# Patient Record
Sex: Female | Born: 1940 | Race: White | Hispanic: No | Marital: Married
Health system: Southern US, Community
[De-identification: ages and names within clinical notes are randomized; demographics above are authoritative.]

---

## 2002-02-04 ENCOUNTER — Encounter: Payer: Self-pay | Admitting: Dermatology

## 2002-02-04 ENCOUNTER — Encounter: Admission: RE | Admit: 2002-02-04 | Discharge: 2002-02-04 | Payer: Self-pay | Admitting: Dermatology

## 2002-03-11 ENCOUNTER — Ambulatory Visit (HOSPITAL_COMMUNITY): Admission: RE | Admit: 2002-03-11 | Discharge: 2002-03-11 | Payer: Self-pay | Admitting: *Deleted

## 2002-07-09 ENCOUNTER — Other Ambulatory Visit: Admission: RE | Admit: 2002-07-09 | Discharge: 2002-07-09 | Payer: Self-pay | Admitting: *Deleted

## 2003-07-16 ENCOUNTER — Other Ambulatory Visit: Admission: RE | Admit: 2003-07-16 | Discharge: 2003-07-16 | Payer: Self-pay | Admitting: *Deleted

## 2005-08-01 ENCOUNTER — Other Ambulatory Visit: Admission: RE | Admit: 2005-08-01 | Discharge: 2005-08-01 | Payer: Self-pay | Admitting: Obstetrics and Gynecology

## 2006-08-08 ENCOUNTER — Other Ambulatory Visit: Admission: RE | Admit: 2006-08-08 | Discharge: 2006-08-08 | Payer: Self-pay | Admitting: Obstetrics and Gynecology

## 2007-08-28 ENCOUNTER — Other Ambulatory Visit: Admission: RE | Admit: 2007-08-28 | Discharge: 2007-08-28 | Payer: Self-pay | Admitting: Obstetrics and Gynecology

## 2008-08-30 ENCOUNTER — Other Ambulatory Visit: Admission: RE | Admit: 2008-08-30 | Discharge: 2008-08-30 | Payer: Self-pay | Admitting: Obstetrics & Gynecology

## 2014-03-19 ENCOUNTER — Encounter (INDEPENDENT_AMBULATORY_CARE_PROVIDER_SITE_OTHER): Payer: Self-pay | Admitting: Ophthalmology

## 2014-11-02 DIAGNOSIS — Z1389 Encounter for screening for other disorder: Secondary | ICD-10-CM | POA: Diagnosis not present

## 2014-11-02 DIAGNOSIS — R634 Abnormal weight loss: Secondary | ICD-10-CM | POA: Diagnosis not present

## 2014-11-02 DIAGNOSIS — D649 Anemia, unspecified: Secondary | ICD-10-CM | POA: Diagnosis not present

## 2014-11-02 DIAGNOSIS — Z681 Body mass index (BMI) 19 or less, adult: Secondary | ICD-10-CM | POA: Diagnosis not present

## 2014-11-02 DIAGNOSIS — R636 Underweight: Secondary | ICD-10-CM | POA: Diagnosis not present

## 2014-11-02 DIAGNOSIS — M859 Disorder of bone density and structure, unspecified: Secondary | ICD-10-CM | POA: Diagnosis not present

## 2014-11-02 DIAGNOSIS — M8580 Other specified disorders of bone density and structure, unspecified site: Secondary | ICD-10-CM | POA: Diagnosis not present

## 2015-03-29 DIAGNOSIS — Z1231 Encounter for screening mammogram for malignant neoplasm of breast: Secondary | ICD-10-CM | POA: Diagnosis not present

## 2015-05-05 DIAGNOSIS — D649 Anemia, unspecified: Secondary | ICD-10-CM | POA: Diagnosis not present

## 2015-05-05 DIAGNOSIS — E559 Vitamin D deficiency, unspecified: Secondary | ICD-10-CM | POA: Diagnosis not present

## 2015-05-05 DIAGNOSIS — Z0000 Encounter for general adult medical examination without abnormal findings: Secondary | ICD-10-CM | POA: Diagnosis not present

## 2015-05-05 DIAGNOSIS — R634 Abnormal weight loss: Secondary | ICD-10-CM | POA: Diagnosis not present

## 2015-05-12 DIAGNOSIS — E559 Vitamin D deficiency, unspecified: Secondary | ICD-10-CM | POA: Diagnosis not present

## 2015-05-12 DIAGNOSIS — I872 Venous insufficiency (chronic) (peripheral): Secondary | ICD-10-CM | POA: Diagnosis not present

## 2015-05-12 DIAGNOSIS — Z23 Encounter for immunization: Secondary | ICD-10-CM | POA: Diagnosis not present

## 2015-05-12 DIAGNOSIS — M8580 Other specified disorders of bone density and structure, unspecified site: Secondary | ICD-10-CM | POA: Diagnosis not present

## 2015-05-12 DIAGNOSIS — D649 Anemia, unspecified: Secondary | ICD-10-CM | POA: Diagnosis not present

## 2015-05-12 DIAGNOSIS — R636 Underweight: Secondary | ICD-10-CM | POA: Diagnosis not present

## 2015-05-12 DIAGNOSIS — M545 Low back pain: Secondary | ICD-10-CM | POA: Diagnosis not present

## 2015-05-12 DIAGNOSIS — E46 Unspecified protein-calorie malnutrition: Secondary | ICD-10-CM | POA: Diagnosis not present

## 2015-05-12 DIAGNOSIS — R634 Abnormal weight loss: Secondary | ICD-10-CM | POA: Diagnosis not present

## 2015-05-19 DIAGNOSIS — H2512 Age-related nuclear cataract, left eye: Secondary | ICD-10-CM | POA: Diagnosis not present

## 2015-05-19 DIAGNOSIS — H25041 Posterior subcapsular polar age-related cataract, right eye: Secondary | ICD-10-CM | POA: Diagnosis not present

## 2015-05-19 DIAGNOSIS — H25011 Cortical age-related cataract, right eye: Secondary | ICD-10-CM | POA: Diagnosis not present

## 2015-05-19 DIAGNOSIS — H2511 Age-related nuclear cataract, right eye: Secondary | ICD-10-CM | POA: Diagnosis not present

## 2015-05-30 DIAGNOSIS — Z1212 Encounter for screening for malignant neoplasm of rectum: Secondary | ICD-10-CM | POA: Diagnosis not present

## 2015-06-10 DIAGNOSIS — Z1211 Encounter for screening for malignant neoplasm of colon: Secondary | ICD-10-CM | POA: Diagnosis not present

## 2015-06-10 DIAGNOSIS — K5730 Diverticulosis of large intestine without perforation or abscess without bleeding: Secondary | ICD-10-CM | POA: Diagnosis not present

## 2015-06-10 DIAGNOSIS — Z800 Family history of malignant neoplasm of digestive organs: Secondary | ICD-10-CM | POA: Diagnosis not present

## 2015-11-10 DIAGNOSIS — R700 Elevated erythrocyte sedimentation rate: Secondary | ICD-10-CM | POA: Diagnosis not present

## 2015-11-10 DIAGNOSIS — Z681 Body mass index (BMI) 19 or less, adult: Secondary | ICD-10-CM | POA: Diagnosis not present

## 2015-11-10 DIAGNOSIS — M25551 Pain in right hip: Secondary | ICD-10-CM | POA: Diagnosis not present

## 2015-11-10 DIAGNOSIS — I872 Venous insufficiency (chronic) (peripheral): Secondary | ICD-10-CM | POA: Diagnosis not present

## 2015-11-10 DIAGNOSIS — R600 Localized edema: Secondary | ICD-10-CM | POA: Diagnosis not present

## 2015-11-10 DIAGNOSIS — D649 Anemia, unspecified: Secondary | ICD-10-CM | POA: Diagnosis not present

## 2015-11-10 DIAGNOSIS — M545 Low back pain: Secondary | ICD-10-CM | POA: Diagnosis not present

## 2015-11-10 DIAGNOSIS — D6489 Other specified anemias: Secondary | ICD-10-CM | POA: Diagnosis not present

## 2015-11-10 DIAGNOSIS — E46 Unspecified protein-calorie malnutrition: Secondary | ICD-10-CM | POA: Diagnosis not present

## 2015-11-14 ENCOUNTER — Other Ambulatory Visit: Payer: Self-pay | Admitting: Internal Medicine

## 2015-11-14 DIAGNOSIS — R634 Abnormal weight loss: Secondary | ICD-10-CM

## 2015-11-16 ENCOUNTER — Ambulatory Visit
Admission: RE | Admit: 2015-11-16 | Discharge: 2015-11-16 | Disposition: A | Discharge status: Home / Self Care | Payer: Commercial Managed Care - HMO | Source: Ambulatory Visit | Attending: Internal Medicine | Admitting: Internal Medicine

## 2015-11-16 DIAGNOSIS — K769 Liver disease, unspecified: Secondary | ICD-10-CM | POA: Diagnosis not present

## 2015-11-16 DIAGNOSIS — R634 Abnormal weight loss: Secondary | ICD-10-CM

## 2015-11-16 MED ORDER — IOPAMIDOL (ISOVUE-300) INJECTION 61%
100.0000 mL | Freq: Once | INTRAVENOUS | Status: AC | PRN
  Administered 2015-11-16: 100 mL via INTRAVENOUS

## 2015-11-22 ENCOUNTER — Ambulatory Visit (HOSPITAL_COMMUNITY)
Admission: RE | Admit: 2015-11-22 | Discharge: 2015-11-22 | Disposition: A | Discharge status: Home / Self Care | Payer: Commercial Managed Care - HMO | Source: Ambulatory Visit | Attending: Physician Assistant | Admitting: Physician Assistant

## 2015-11-22 ENCOUNTER — Other Ambulatory Visit (HOSPITAL_COMMUNITY): Payer: Self-pay | Admitting: Physician Assistant

## 2015-11-22 DIAGNOSIS — M79604 Pain in right leg: Secondary | ICD-10-CM | POA: Insufficient documentation

## 2015-11-22 DIAGNOSIS — M79661 Pain in right lower leg: Secondary | ICD-10-CM

## 2015-11-22 DIAGNOSIS — M2550 Pain in unspecified joint: Secondary | ICD-10-CM | POA: Diagnosis not present

## 2015-11-22 DIAGNOSIS — R5383 Other fatigue: Secondary | ICD-10-CM | POA: Diagnosis not present

## 2015-11-22 DIAGNOSIS — M7989 Other specified soft tissue disorders: Secondary | ICD-10-CM

## 2015-11-22 NOTE — Progress Notes (Signed)
VASCULAR LAB PRELIMINARY  PRELIMINARY  PRELIMINARY  PRELIMINARY  Bilateral lower extremity venous duplex completed.     Right:  No evidence of DVT, superficial thrombosis, or Baker's cyst.  Called results to Dr. Roxy Cedar nurse, Amy @3 :12 pm  Caoimhe Damron, RVT, RDMS 11/22/2015, 3:11 PM

## 2015-12-06 DIAGNOSIS — M069 Rheumatoid arthritis, unspecified: Secondary | ICD-10-CM | POA: Diagnosis not present

## 2015-12-06 DIAGNOSIS — D6489 Other specified anemias: Secondary | ICD-10-CM | POA: Diagnosis not present

## 2015-12-06 DIAGNOSIS — M0579 Rheumatoid arthritis with rheumatoid factor of multiple sites without organ or systems involvement: Secondary | ICD-10-CM | POA: Diagnosis not present

## 2015-12-06 DIAGNOSIS — D649 Anemia, unspecified: Secondary | ICD-10-CM | POA: Diagnosis not present

## 2015-12-06 DIAGNOSIS — M2550 Pain in unspecified joint: Secondary | ICD-10-CM | POA: Diagnosis not present

## 2015-12-06 DIAGNOSIS — R5383 Other fatigue: Secondary | ICD-10-CM | POA: Diagnosis not present

## 2016-01-05 DIAGNOSIS — M0579 Rheumatoid arthritis with rheumatoid factor of multiple sites without organ or systems involvement: Secondary | ICD-10-CM | POA: Diagnosis not present

## 2016-01-05 DIAGNOSIS — M2550 Pain in unspecified joint: Secondary | ICD-10-CM | POA: Diagnosis not present

## 2016-01-05 DIAGNOSIS — R5383 Other fatigue: Secondary | ICD-10-CM | POA: Diagnosis not present

## 2016-01-05 DIAGNOSIS — M353 Polymyalgia rheumatica: Secondary | ICD-10-CM | POA: Diagnosis not present

## 2016-03-08 DIAGNOSIS — R5383 Other fatigue: Secondary | ICD-10-CM | POA: Diagnosis not present

## 2016-03-08 DIAGNOSIS — M0579 Rheumatoid arthritis with rheumatoid factor of multiple sites without organ or systems involvement: Secondary | ICD-10-CM | POA: Diagnosis not present

## 2016-03-08 DIAGNOSIS — M2550 Pain in unspecified joint: Secondary | ICD-10-CM | POA: Diagnosis not present

## 2016-03-08 DIAGNOSIS — M5489 Other dorsalgia: Secondary | ICD-10-CM | POA: Diagnosis not present

## 2016-03-08 DIAGNOSIS — M353 Polymyalgia rheumatica: Secondary | ICD-10-CM | POA: Diagnosis not present

## 2016-03-12 ENCOUNTER — Other Ambulatory Visit: Payer: Self-pay | Admitting: Rheumatology

## 2016-03-12 DIAGNOSIS — M545 Low back pain: Secondary | ICD-10-CM

## 2016-03-22 ENCOUNTER — Ambulatory Visit
Admission: RE | Admit: 2016-03-22 | Discharge: 2016-03-22 | Disposition: A | Discharge status: Home / Self Care | Payer: Commercial Managed Care - HMO | Source: Ambulatory Visit | Attending: Rheumatology | Admitting: Rheumatology

## 2016-03-22 DIAGNOSIS — M545 Low back pain: Secondary | ICD-10-CM

## 2016-03-22 DIAGNOSIS — M4806 Spinal stenosis, lumbar region: Secondary | ICD-10-CM | POA: Diagnosis not present

## 2016-04-11 DIAGNOSIS — M4806 Spinal stenosis, lumbar region: Secondary | ICD-10-CM | POA: Diagnosis not present

## 2016-04-24 DIAGNOSIS — M4806 Spinal stenosis, lumbar region: Secondary | ICD-10-CM | POA: Diagnosis not present

## 2016-05-02 DIAGNOSIS — Z1231 Encounter for screening mammogram for malignant neoplasm of breast: Secondary | ICD-10-CM | POA: Diagnosis not present

## 2016-05-10 DIAGNOSIS — D649 Anemia, unspecified: Secondary | ICD-10-CM | POA: Diagnosis not present

## 2016-05-10 DIAGNOSIS — E559 Vitamin D deficiency, unspecified: Secondary | ICD-10-CM | POA: Diagnosis not present

## 2016-05-10 DIAGNOSIS — Z0000 Encounter for general adult medical examination without abnormal findings: Secondary | ICD-10-CM | POA: Diagnosis not present

## 2016-05-10 DIAGNOSIS — R8299 Other abnormal findings in urine: Secondary | ICD-10-CM | POA: Diagnosis not present

## 2016-05-10 DIAGNOSIS — E46 Unspecified protein-calorie malnutrition: Secondary | ICD-10-CM | POA: Diagnosis not present

## 2016-05-17 DIAGNOSIS — M859 Disorder of bone density and structure, unspecified: Secondary | ICD-10-CM | POA: Diagnosis not present

## 2016-05-17 DIAGNOSIS — E441 Mild protein-calorie malnutrition: Secondary | ICD-10-CM | POA: Diagnosis not present

## 2016-05-17 DIAGNOSIS — D6489 Other specified anemias: Secondary | ICD-10-CM | POA: Diagnosis not present

## 2016-05-17 DIAGNOSIS — Z0000 Encounter for general adult medical examination without abnormal findings: Secondary | ICD-10-CM | POA: Diagnosis not present

## 2016-05-17 DIAGNOSIS — I868 Varicose veins of other specified sites: Secondary | ICD-10-CM | POA: Diagnosis not present

## 2016-05-17 DIAGNOSIS — M353 Polymyalgia rheumatica: Secondary | ICD-10-CM | POA: Diagnosis not present

## 2016-05-17 DIAGNOSIS — R636 Underweight: Secondary | ICD-10-CM | POA: Diagnosis not present

## 2016-05-17 DIAGNOSIS — M545 Low back pain: Secondary | ICD-10-CM | POA: Diagnosis not present

## 2016-05-17 DIAGNOSIS — M0689 Other specified rheumatoid arthritis, multiple sites: Secondary | ICD-10-CM | POA: Diagnosis not present

## 2016-05-23 DIAGNOSIS — Z1212 Encounter for screening for malignant neoplasm of rectum: Secondary | ICD-10-CM | POA: Diagnosis not present

## 2016-05-24 DIAGNOSIS — M4806 Spinal stenosis, lumbar region: Secondary | ICD-10-CM | POA: Diagnosis not present

## 2016-06-21 DIAGNOSIS — M48061 Spinal stenosis, lumbar region without neurogenic claudication: Secondary | ICD-10-CM | POA: Diagnosis not present

## 2016-08-08 DIAGNOSIS — D6489 Other specified anemias: Secondary | ICD-10-CM | POA: Diagnosis not present

## 2016-08-08 DIAGNOSIS — D649 Anemia, unspecified: Secondary | ICD-10-CM | POA: Diagnosis not present

## 2016-08-09 DIAGNOSIS — H903 Sensorineural hearing loss, bilateral: Secondary | ICD-10-CM | POA: Diagnosis not present

## 2016-08-24 DIAGNOSIS — H903 Sensorineural hearing loss, bilateral: Secondary | ICD-10-CM | POA: Diagnosis not present

## 2016-11-13 DIAGNOSIS — D692 Other nonthrombocytopenic purpura: Secondary | ICD-10-CM | POA: Diagnosis not present

## 2016-11-13 DIAGNOSIS — M353 Polymyalgia rheumatica: Secondary | ICD-10-CM | POA: Diagnosis not present

## 2016-11-13 DIAGNOSIS — R760 Raised antibody titer: Secondary | ICD-10-CM | POA: Diagnosis not present

## 2016-11-13 DIAGNOSIS — E46 Unspecified protein-calorie malnutrition: Secondary | ICD-10-CM | POA: Diagnosis not present

## 2016-11-13 DIAGNOSIS — I878 Other specified disorders of veins: Secondary | ICD-10-CM | POA: Diagnosis not present

## 2016-11-13 DIAGNOSIS — D649 Anemia, unspecified: Secondary | ICD-10-CM | POA: Diagnosis not present

## 2016-11-13 DIAGNOSIS — M0689 Other specified rheumatoid arthritis, multiple sites: Secondary | ICD-10-CM | POA: Diagnosis not present

## 2016-11-13 DIAGNOSIS — Z681 Body mass index (BMI) 19 or less, adult: Secondary | ICD-10-CM | POA: Diagnosis not present

## 2016-11-13 DIAGNOSIS — M545 Low back pain: Secondary | ICD-10-CM | POA: Diagnosis not present

## 2016-11-27 DIAGNOSIS — M353 Polymyalgia rheumatica: Secondary | ICD-10-CM | POA: Diagnosis not present

## 2016-11-27 DIAGNOSIS — M0579 Rheumatoid arthritis with rheumatoid factor of multiple sites without organ or systems involvement: Secondary | ICD-10-CM | POA: Diagnosis not present

## 2016-11-27 DIAGNOSIS — Z681 Body mass index (BMI) 19 or less, adult: Secondary | ICD-10-CM | POA: Diagnosis not present

## 2016-11-27 DIAGNOSIS — R5383 Other fatigue: Secondary | ICD-10-CM | POA: Diagnosis not present

## 2016-11-27 DIAGNOSIS — M2550 Pain in unspecified joint: Secondary | ICD-10-CM | POA: Diagnosis not present

## 2016-11-27 DIAGNOSIS — M5489 Other dorsalgia: Secondary | ICD-10-CM | POA: Diagnosis not present

## 2017-02-13 DIAGNOSIS — Z681 Body mass index (BMI) 19 or less, adult: Secondary | ICD-10-CM | POA: Diagnosis not present

## 2017-02-13 DIAGNOSIS — M5489 Other dorsalgia: Secondary | ICD-10-CM | POA: Diagnosis not present

## 2017-02-13 DIAGNOSIS — R768 Other specified abnormal immunological findings in serum: Secondary | ICD-10-CM | POA: Diagnosis not present

## 2017-02-13 DIAGNOSIS — M353 Polymyalgia rheumatica: Secondary | ICD-10-CM | POA: Diagnosis not present

## 2017-02-13 DIAGNOSIS — M2550 Pain in unspecified joint: Secondary | ICD-10-CM | POA: Diagnosis not present

## 2017-02-13 DIAGNOSIS — R5383 Other fatigue: Secondary | ICD-10-CM | POA: Diagnosis not present

## 2017-04-23 DIAGNOSIS — M48061 Spinal stenosis, lumbar region without neurogenic claudication: Secondary | ICD-10-CM | POA: Diagnosis not present

## 2017-04-26 DIAGNOSIS — M48061 Spinal stenosis, lumbar region without neurogenic claudication: Secondary | ICD-10-CM | POA: Diagnosis not present

## 2017-04-26 DIAGNOSIS — M545 Low back pain: Secondary | ICD-10-CM | POA: Diagnosis not present

## 2017-04-30 DIAGNOSIS — M48061 Spinal stenosis, lumbar region without neurogenic claudication: Secondary | ICD-10-CM | POA: Diagnosis not present

## 2017-04-30 DIAGNOSIS — M545 Low back pain: Secondary | ICD-10-CM | POA: Diagnosis not present

## 2017-05-02 DIAGNOSIS — M48061 Spinal stenosis, lumbar region without neurogenic claudication: Secondary | ICD-10-CM | POA: Diagnosis not present

## 2017-05-02 DIAGNOSIS — M545 Low back pain: Secondary | ICD-10-CM | POA: Diagnosis not present

## 2017-05-03 DIAGNOSIS — Z1231 Encounter for screening mammogram for malignant neoplasm of breast: Secondary | ICD-10-CM | POA: Diagnosis not present

## 2017-05-07 DIAGNOSIS — M48061 Spinal stenosis, lumbar region without neurogenic claudication: Secondary | ICD-10-CM | POA: Diagnosis not present

## 2017-05-07 DIAGNOSIS — M545 Low back pain: Secondary | ICD-10-CM | POA: Diagnosis not present

## 2017-05-09 DIAGNOSIS — M545 Low back pain: Secondary | ICD-10-CM | POA: Diagnosis not present

## 2017-05-09 DIAGNOSIS — M48061 Spinal stenosis, lumbar region without neurogenic claudication: Secondary | ICD-10-CM | POA: Diagnosis not present

## 2017-05-14 DIAGNOSIS — D649 Anemia, unspecified: Secondary | ICD-10-CM | POA: Diagnosis not present

## 2017-05-14 DIAGNOSIS — M859 Disorder of bone density and structure, unspecified: Secondary | ICD-10-CM | POA: Diagnosis not present

## 2017-05-14 DIAGNOSIS — Z0000 Encounter for general adult medical examination without abnormal findings: Secondary | ICD-10-CM | POA: Diagnosis not present

## 2017-05-15 DIAGNOSIS — M48061 Spinal stenosis, lumbar region without neurogenic claudication: Secondary | ICD-10-CM | POA: Diagnosis not present

## 2017-05-15 DIAGNOSIS — M545 Low back pain: Secondary | ICD-10-CM | POA: Diagnosis not present

## 2017-05-15 DIAGNOSIS — D649 Anemia, unspecified: Secondary | ICD-10-CM | POA: Diagnosis not present

## 2017-05-17 DIAGNOSIS — M48061 Spinal stenosis, lumbar region without neurogenic claudication: Secondary | ICD-10-CM | POA: Diagnosis not present

## 2017-05-17 DIAGNOSIS — M545 Low back pain: Secondary | ICD-10-CM | POA: Diagnosis not present

## 2017-05-20 DIAGNOSIS — M48061 Spinal stenosis, lumbar region without neurogenic claudication: Secondary | ICD-10-CM | POA: Diagnosis not present

## 2017-05-20 DIAGNOSIS — M545 Low back pain: Secondary | ICD-10-CM | POA: Diagnosis not present

## 2017-05-21 DIAGNOSIS — E46 Unspecified protein-calorie malnutrition: Secondary | ICD-10-CM | POA: Diagnosis not present

## 2017-05-21 DIAGNOSIS — M069 Rheumatoid arthritis, unspecified: Secondary | ICD-10-CM | POA: Diagnosis not present

## 2017-05-21 DIAGNOSIS — M48061 Spinal stenosis, lumbar region without neurogenic claudication: Secondary | ICD-10-CM | POA: Diagnosis not present

## 2017-05-21 DIAGNOSIS — R700 Elevated erythrocyte sedimentation rate: Secondary | ICD-10-CM | POA: Diagnosis not present

## 2017-05-21 DIAGNOSIS — Z1389 Encounter for screening for other disorder: Secondary | ICD-10-CM | POA: Diagnosis not present

## 2017-05-21 DIAGNOSIS — M353 Polymyalgia rheumatica: Secondary | ICD-10-CM | POA: Diagnosis not present

## 2017-05-21 DIAGNOSIS — R760 Raised antibody titer: Secondary | ICD-10-CM | POA: Diagnosis not present

## 2017-05-21 DIAGNOSIS — R918 Other nonspecific abnormal finding of lung field: Secondary | ICD-10-CM | POA: Diagnosis not present

## 2017-05-21 DIAGNOSIS — R636 Underweight: Secondary | ICD-10-CM | POA: Diagnosis not present

## 2017-05-21 DIAGNOSIS — M545 Low back pain: Secondary | ICD-10-CM | POA: Diagnosis not present

## 2017-05-21 DIAGNOSIS — D692 Other nonthrombocytopenic purpura: Secondary | ICD-10-CM | POA: Diagnosis not present

## 2017-05-21 DIAGNOSIS — Z0000 Encounter for general adult medical examination without abnormal findings: Secondary | ICD-10-CM | POA: Diagnosis not present

## 2017-05-21 DIAGNOSIS — Z681 Body mass index (BMI) 19 or less, adult: Secondary | ICD-10-CM | POA: Diagnosis not present

## 2017-05-21 DIAGNOSIS — Z23 Encounter for immunization: Secondary | ICD-10-CM | POA: Diagnosis not present

## 2017-05-24 DIAGNOSIS — Z1212 Encounter for screening for malignant neoplasm of rectum: Secondary | ICD-10-CM | POA: Diagnosis not present

## 2017-05-27 DIAGNOSIS — M545 Low back pain: Secondary | ICD-10-CM | POA: Diagnosis not present

## 2017-05-27 DIAGNOSIS — M48061 Spinal stenosis, lumbar region without neurogenic claudication: Secondary | ICD-10-CM | POA: Diagnosis not present

## 2017-05-31 DIAGNOSIS — M48061 Spinal stenosis, lumbar region without neurogenic claudication: Secondary | ICD-10-CM | POA: Diagnosis not present

## 2017-05-31 DIAGNOSIS — M545 Low back pain: Secondary | ICD-10-CM | POA: Diagnosis not present

## 2017-06-03 DIAGNOSIS — M48061 Spinal stenosis, lumbar region without neurogenic claudication: Secondary | ICD-10-CM | POA: Diagnosis not present

## 2017-06-03 DIAGNOSIS — M545 Low back pain: Secondary | ICD-10-CM | POA: Diagnosis not present

## 2017-06-05 DIAGNOSIS — M48061 Spinal stenosis, lumbar region without neurogenic claudication: Secondary | ICD-10-CM | POA: Diagnosis not present

## 2017-06-05 DIAGNOSIS — M545 Low back pain: Secondary | ICD-10-CM | POA: Diagnosis not present

## 2017-06-06 DIAGNOSIS — H25043 Posterior subcapsular polar age-related cataract, bilateral: Secondary | ICD-10-CM | POA: Diagnosis not present

## 2017-06-06 DIAGNOSIS — H18413 Arcus senilis, bilateral: Secondary | ICD-10-CM | POA: Diagnosis not present

## 2017-06-06 DIAGNOSIS — H25013 Cortical age-related cataract, bilateral: Secondary | ICD-10-CM | POA: Diagnosis not present

## 2017-06-06 DIAGNOSIS — H2512 Age-related nuclear cataract, left eye: Secondary | ICD-10-CM | POA: Diagnosis not present

## 2017-06-06 DIAGNOSIS — H2513 Age-related nuclear cataract, bilateral: Secondary | ICD-10-CM | POA: Diagnosis not present

## 2017-06-10 DIAGNOSIS — M48061 Spinal stenosis, lumbar region without neurogenic claudication: Secondary | ICD-10-CM | POA: Diagnosis not present

## 2017-06-10 DIAGNOSIS — M545 Low back pain: Secondary | ICD-10-CM | POA: Diagnosis not present

## 2017-06-13 DIAGNOSIS — M545 Low back pain: Secondary | ICD-10-CM | POA: Diagnosis not present

## 2017-06-13 DIAGNOSIS — M48061 Spinal stenosis, lumbar region without neurogenic claudication: Secondary | ICD-10-CM | POA: Diagnosis not present

## 2017-07-18 DIAGNOSIS — R768 Other specified abnormal immunological findings in serum: Secondary | ICD-10-CM | POA: Diagnosis not present

## 2017-07-18 DIAGNOSIS — M353 Polymyalgia rheumatica: Secondary | ICD-10-CM | POA: Diagnosis not present

## 2017-07-18 DIAGNOSIS — M5489 Other dorsalgia: Secondary | ICD-10-CM | POA: Diagnosis not present

## 2017-07-18 DIAGNOSIS — R5383 Other fatigue: Secondary | ICD-10-CM | POA: Diagnosis not present

## 2017-07-18 DIAGNOSIS — Z681 Body mass index (BMI) 19 or less, adult: Secondary | ICD-10-CM | POA: Diagnosis not present

## 2017-07-18 DIAGNOSIS — M2550 Pain in unspecified joint: Secondary | ICD-10-CM | POA: Diagnosis not present

## 2017-07-24 DIAGNOSIS — Z79899 Other long term (current) drug therapy: Secondary | ICD-10-CM | POA: Diagnosis not present

## 2017-07-24 DIAGNOSIS — M1990 Unspecified osteoarthritis, unspecified site: Secondary | ICD-10-CM | POA: Diagnosis not present

## 2017-07-24 DIAGNOSIS — R011 Cardiac murmur, unspecified: Secondary | ICD-10-CM | POA: Diagnosis not present

## 2017-07-24 DIAGNOSIS — H2512 Age-related nuclear cataract, left eye: Secondary | ICD-10-CM | POA: Diagnosis not present

## 2017-07-25 DIAGNOSIS — H2511 Age-related nuclear cataract, right eye: Secondary | ICD-10-CM | POA: Diagnosis not present

## 2017-08-14 DIAGNOSIS — H2511 Age-related nuclear cataract, right eye: Secondary | ICD-10-CM | POA: Diagnosis not present

## 2017-08-14 DIAGNOSIS — Z79899 Other long term (current) drug therapy: Secondary | ICD-10-CM | POA: Diagnosis not present

## 2017-11-16 IMAGING — MR MR LUMBAR SPINE W/O CM
4 of 5 series · 19 of 48 positions shown · non-contrast
Comparison: CT abdomen/pelvis 11/16/2015

CLINICAL DATA: Low back pain radiating to both hips.

EXAM:
MRI LUMBAR SPINE WITHOUT CONTRAST
TECHNIQUE: Multiplanar, multisequence MR imaging of the lumbar spine was
performed. No intravenous contrast was administered.

[Series 6: T2 · sagittal · 4.0mm · 0.73mm/px · 7 of 15 slices shown (1 of 2)]
[im 1/15]
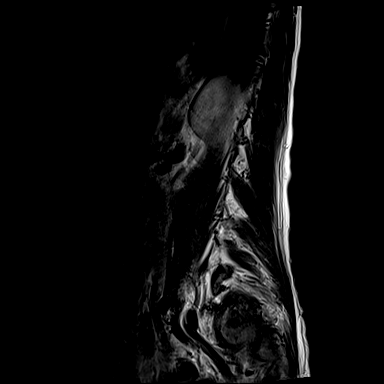
[im 3/15]
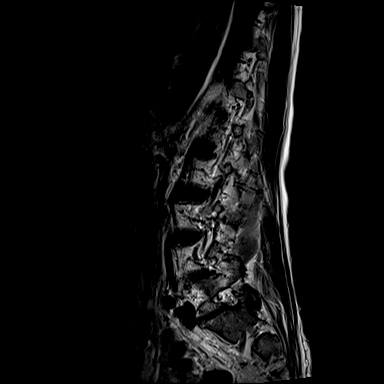
[im 5/15]
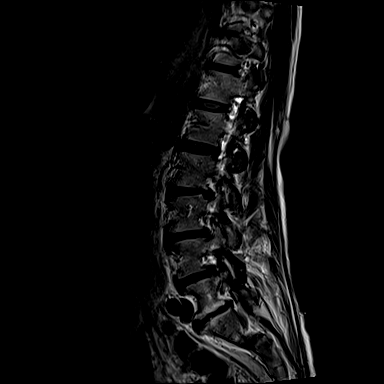
[im 8/15]
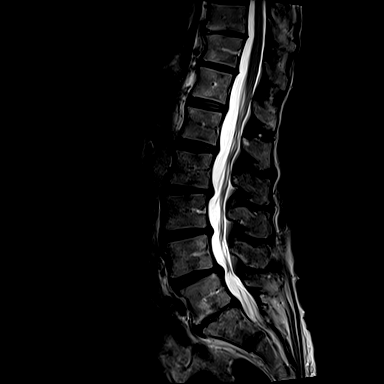
[im 10/15]
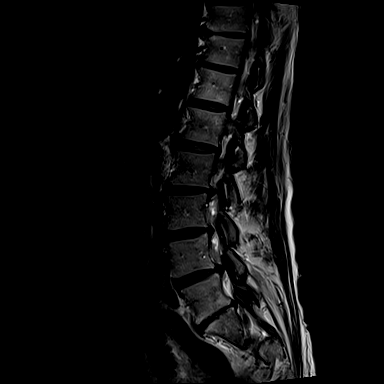
[im 12/15]
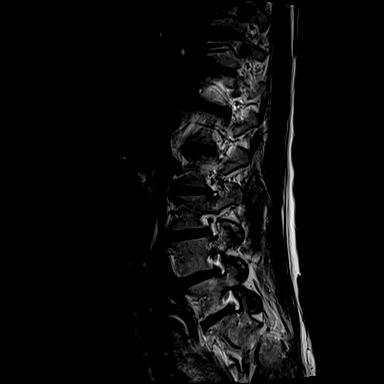
[im 15/15]
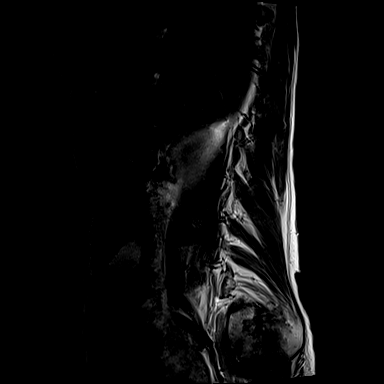

[Series 7: T1 · sagittal · 4.0mm · 0.73mm/px · 3 of 15 slices shown (1 of 2)]
[im 3/15]
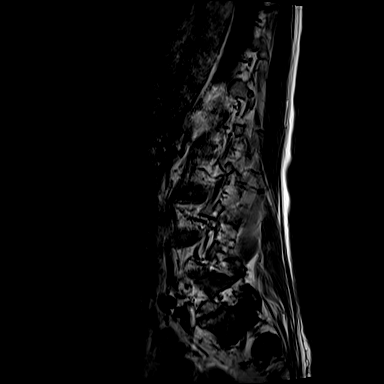
[im 8/15]
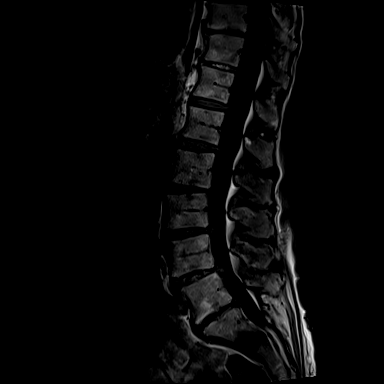
[im 12/15]
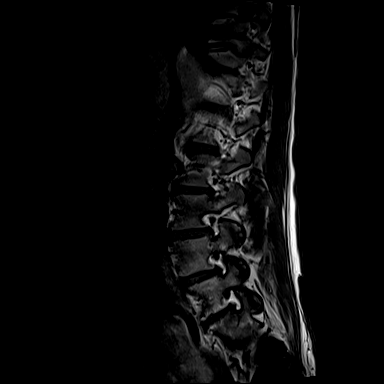

[Series 11: T1 · axial · 4.0mm · 0.28mm/px · z∈[-58,+51]mm · 3 of 34 slices shown (2 of 2)]
[im 6/34]
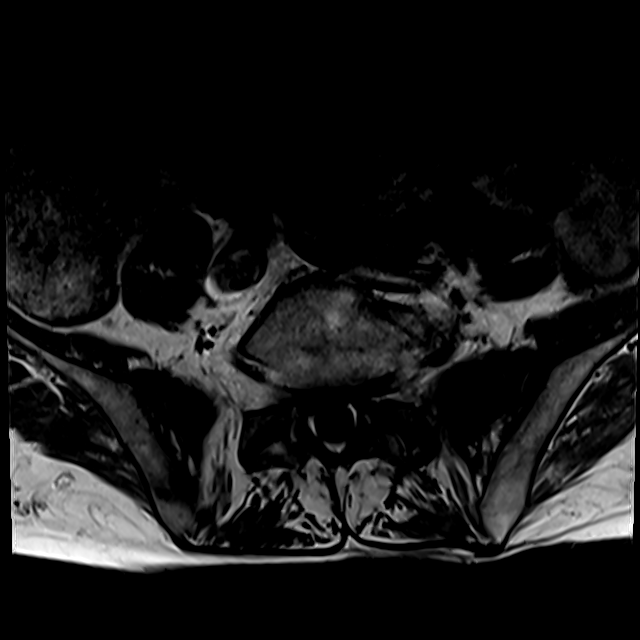
[im 18/34]
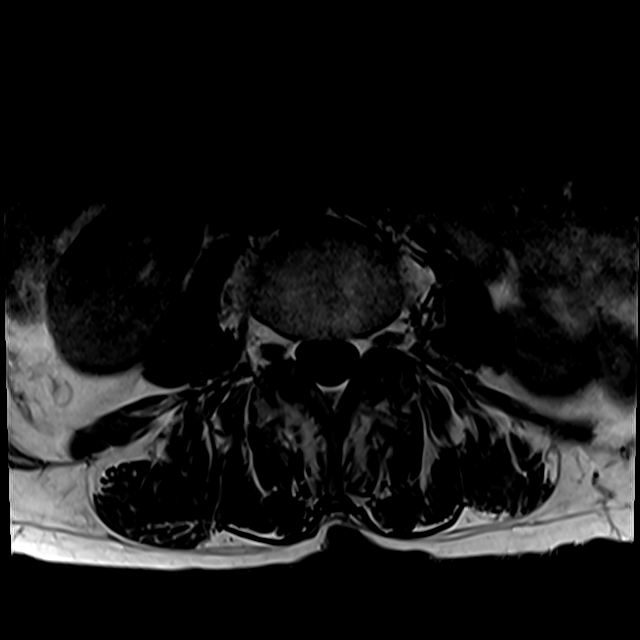
[im 28/34]
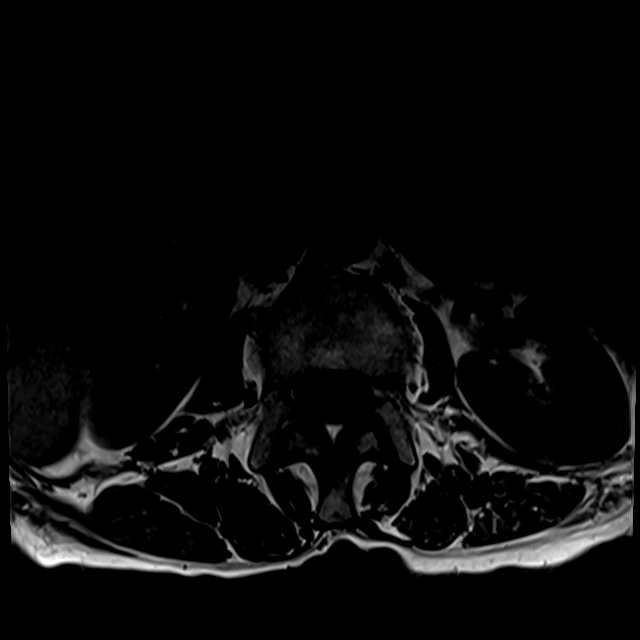

[Series 14: T2 · axial · 4.0mm · 0.28mm/px · z∈[-83,+51]mm · 6 of 34 slices shown (2 of 2)]
[im 1/34]
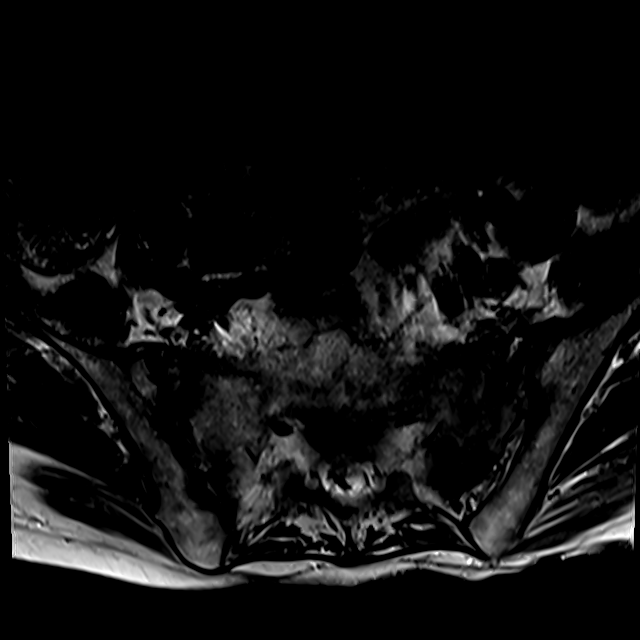
[im 6/34]
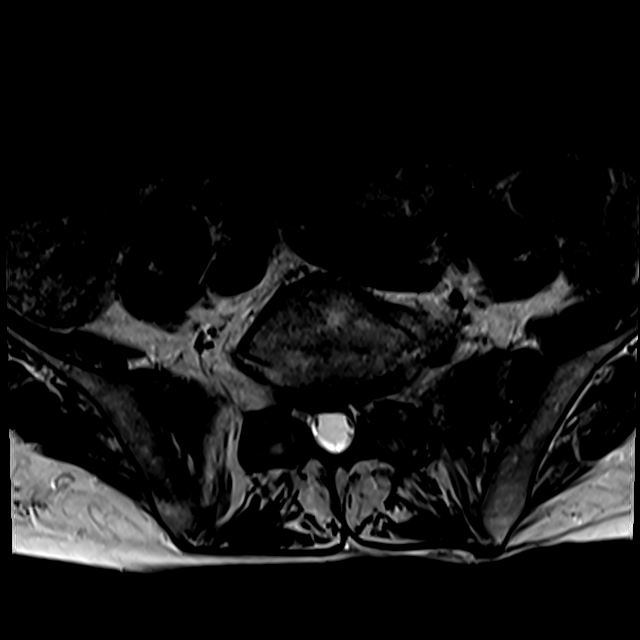
[im 11/34]
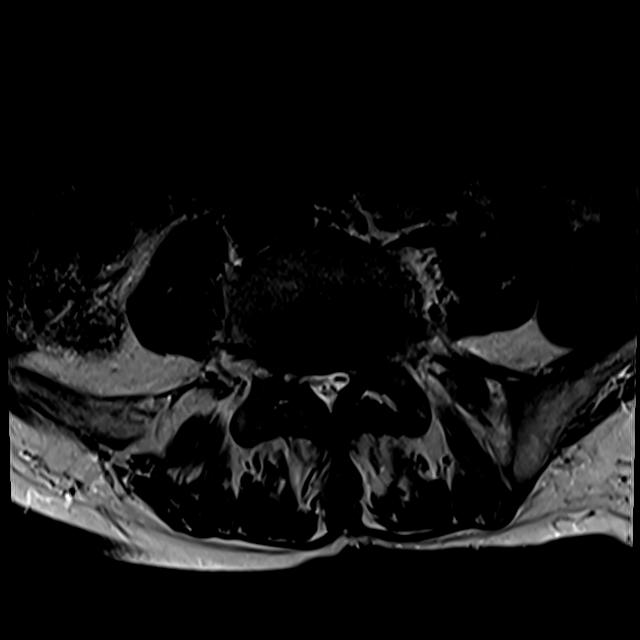
[im 16/34]
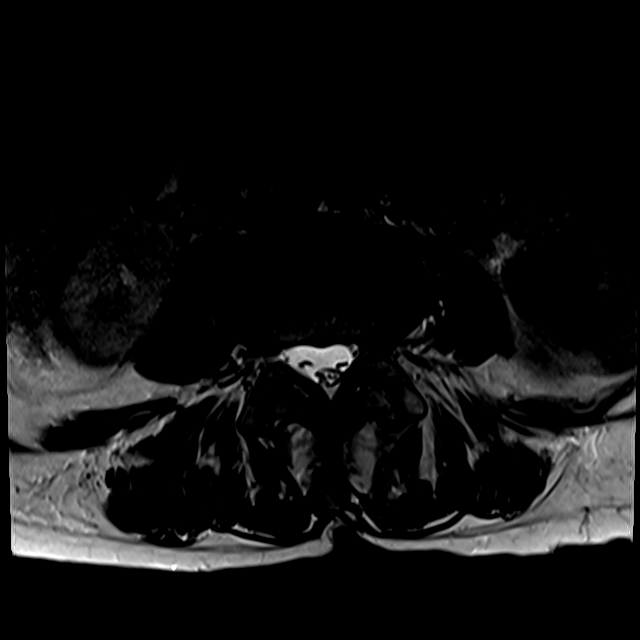
[im 18/34]
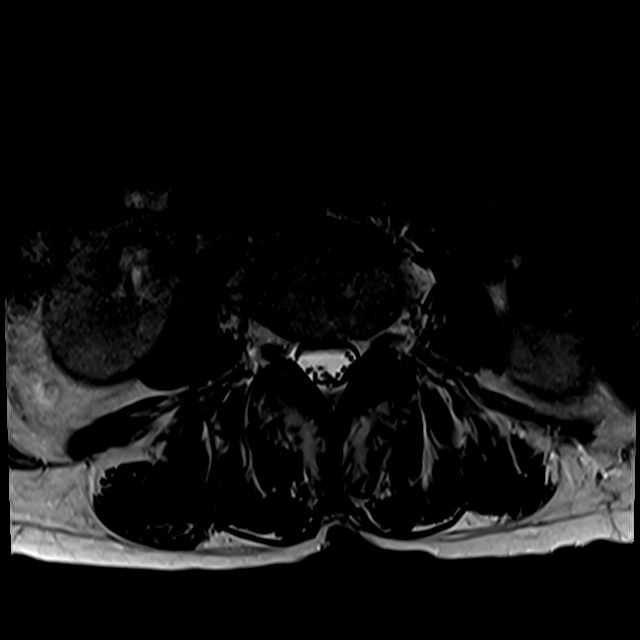
[im 28/34]
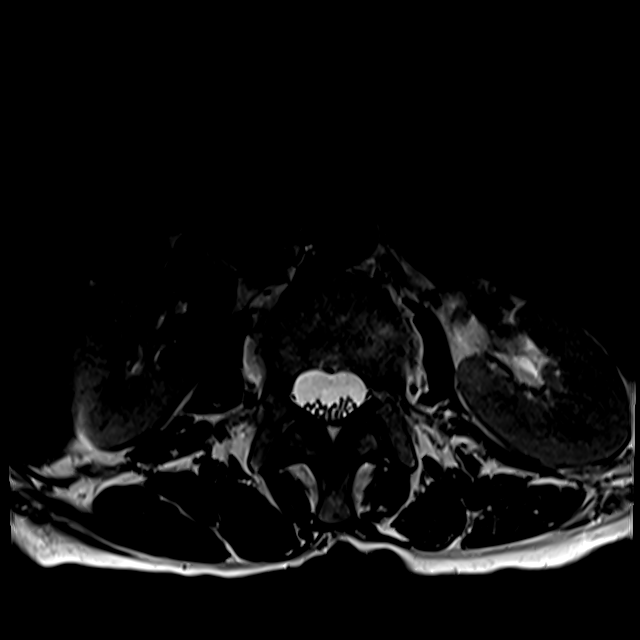

[19 of 48 positions shown; findings below may reference images not displayed]

FINDINGS: Segmentation:  Normal.  Lowest disc space is L5-S1

Alignment:  Normal

Vertebrae: Degenerative endplate signal changes at L5-S1. No focal
marrow lesion. Mild left L5-S1 facet edema.

Conus medullaris: Extends to the L1 level and appears normal.

Paraspinal and other soft tissues: Unremarkable

Disc levels:

There is mild degenerative disc disease at T10-11 and T11-12 without
spinal canal or neural foraminal stenosis.

T12-L1: Normal disc space and facet joints. No spinal canal
stenosis. No neuroforaminal stenosis.

L1-L2: Normal disc space and facet joints. No spinal canal stenosis.
No neuroforaminal stenosis.

L2-L3: Small disc bulge and mild facet hypertrophy. No spinal canal
stenosis. Mild bilateral neuroforaminal stenosis.

L3-L4: Disc bulge with superimposed left foraminal protrusion. Mild
bilateral facet hypertrophy. Narrowing of the left lateral recess.
No spinal canal stenosis. Mild right and mild to moderate left
neuroforaminal stenosis.

L4-L5: Severe bilateral facet hypertrophy with fluid in widening of
the left facet joint. Moderate disc bulge with small left foraminal
protrusion. Mild to moderate spinal canal stenosis. Mild left
neuroforaminal stenosis.

L5-S1: Disc space narrowing. No spinal canal stenosis. No
neuroforaminal stenosis.
IMPRESSION: 1. Mild-to-moderate spinal canal stenosis at L4-L5 secondary to disc
bulge and severe facet arthrosis.
2. Fluid within and widening of the left L4-L5 facet joint may
indicate a degree of segmental instability.
3. Mild left L5-S1 facet edema.

## 2017-11-19 DIAGNOSIS — M353 Polymyalgia rheumatica: Secondary | ICD-10-CM | POA: Diagnosis not present

## 2017-11-19 DIAGNOSIS — E46 Unspecified protein-calorie malnutrition: Secondary | ICD-10-CM | POA: Diagnosis not present

## 2017-11-19 DIAGNOSIS — Z6820 Body mass index (BMI) 20.0-20.9, adult: Secondary | ICD-10-CM | POA: Diagnosis not present

## 2017-11-19 DIAGNOSIS — R600 Localized edema: Secondary | ICD-10-CM | POA: Diagnosis not present

## 2017-11-19 DIAGNOSIS — M859 Disorder of bone density and structure, unspecified: Secondary | ICD-10-CM | POA: Diagnosis not present

## 2017-11-19 DIAGNOSIS — D692 Other nonthrombocytopenic purpura: Secondary | ICD-10-CM | POA: Diagnosis not present

## 2017-11-19 DIAGNOSIS — L723 Sebaceous cyst: Secondary | ICD-10-CM | POA: Diagnosis not present

## 2018-03-20 DIAGNOSIS — H02839 Dermatochalasis of unspecified eye, unspecified eyelid: Secondary | ICD-10-CM | POA: Diagnosis not present

## 2018-03-20 DIAGNOSIS — H18413 Arcus senilis, bilateral: Secondary | ICD-10-CM | POA: Diagnosis not present

## 2018-03-20 DIAGNOSIS — Z961 Presence of intraocular lens: Secondary | ICD-10-CM | POA: Diagnosis not present

## 2018-04-23 DIAGNOSIS — M859 Disorder of bone density and structure, unspecified: Secondary | ICD-10-CM | POA: Diagnosis not present

## 2018-05-05 DIAGNOSIS — Z1231 Encounter for screening mammogram for malignant neoplasm of breast: Secondary | ICD-10-CM | POA: Diagnosis not present

## 2018-05-20 DIAGNOSIS — Z0000 Encounter for general adult medical examination without abnormal findings: Secondary | ICD-10-CM | POA: Diagnosis not present

## 2018-05-20 DIAGNOSIS — D6489 Other specified anemias: Secondary | ICD-10-CM | POA: Diagnosis not present

## 2018-05-20 DIAGNOSIS — R82998 Other abnormal findings in urine: Secondary | ICD-10-CM | POA: Diagnosis not present

## 2018-05-20 DIAGNOSIS — M859 Disorder of bone density and structure, unspecified: Secondary | ICD-10-CM | POA: Diagnosis not present

## 2018-05-27 DIAGNOSIS — M859 Disorder of bone density and structure, unspecified: Secondary | ICD-10-CM | POA: Diagnosis not present

## 2018-05-27 DIAGNOSIS — R918 Other nonspecific abnormal finding of lung field: Secondary | ICD-10-CM | POA: Diagnosis not present

## 2018-05-27 DIAGNOSIS — E559 Vitamin D deficiency, unspecified: Secondary | ICD-10-CM | POA: Diagnosis not present

## 2018-05-27 DIAGNOSIS — I868 Varicose veins of other specified sites: Secondary | ICD-10-CM | POA: Diagnosis not present

## 2018-05-27 DIAGNOSIS — H9193 Unspecified hearing loss, bilateral: Secondary | ICD-10-CM | POA: Diagnosis not present

## 2018-05-27 DIAGNOSIS — Z0000 Encounter for general adult medical examination without abnormal findings: Secondary | ICD-10-CM | POA: Diagnosis not present

## 2018-05-27 DIAGNOSIS — M353 Polymyalgia rheumatica: Secondary | ICD-10-CM | POA: Diagnosis not present

## 2018-05-27 DIAGNOSIS — R636 Underweight: Secondary | ICD-10-CM | POA: Diagnosis not present

## 2018-05-27 DIAGNOSIS — E46 Unspecified protein-calorie malnutrition: Secondary | ICD-10-CM | POA: Diagnosis not present

## 2018-05-27 DIAGNOSIS — Z23 Encounter for immunization: Secondary | ICD-10-CM | POA: Diagnosis not present

## 2018-05-28 DIAGNOSIS — Z1212 Encounter for screening for malignant neoplasm of rectum: Secondary | ICD-10-CM | POA: Diagnosis not present

## 2020-02-22 ENCOUNTER — Other Ambulatory Visit (HOSPITAL_COMMUNITY): Payer: Self-pay | Admitting: Adult Health

## 2020-02-22 DIAGNOSIS — R011 Cardiac murmur, unspecified: Secondary | ICD-10-CM

## 2020-02-29 ENCOUNTER — Encounter (HOSPITAL_COMMUNITY): Payer: Self-pay | Admitting: Adult Health

## 2020-03-25 ENCOUNTER — Other Ambulatory Visit: Payer: Self-pay

## 2020-03-25 ENCOUNTER — Ambulatory Visit (HOSPITAL_COMMUNITY): Payer: Medicare Other | Attending: Cardiovascular Disease

## 2020-03-25 DIAGNOSIS — R011 Cardiac murmur, unspecified: Secondary | ICD-10-CM | POA: Diagnosis present

## 2020-03-25 LAB — ECHOCARDIOGRAM COMPLETE
Area-P 1/2: 2.22 cm2
S' Lateral: 2.4 cm

## 2020-04-12 ENCOUNTER — Ambulatory Visit: Payer: Self-pay | Admitting: Neurology

## 2020-05-02 ENCOUNTER — Ambulatory Visit: Payer: Self-pay | Admitting: Neurology
